# Patient Record
Sex: Male | Born: 1960 | Race: Black or African American | Hispanic: No | Marital: Married | Smoking: Current every day smoker
Health system: Southern US, Community
[De-identification: ages and names within clinical notes are randomized; demographics above are authoritative.]

## PROBLEM LIST (undated history)

## (undated) DIAGNOSIS — I10 Essential (primary) hypertension: Secondary | ICD-10-CM

## (undated) HISTORY — PX: APPENDECTOMY: SHX54

## (undated) HISTORY — PX: OTHER SURGICAL HISTORY: SHX169

---

## 2003-04-24 ENCOUNTER — Other Ambulatory Visit: Payer: Self-pay

## 2004-02-06 ENCOUNTER — Emergency Department: Payer: Self-pay | Admitting: Emergency Medicine

## 2004-10-21 ENCOUNTER — Emergency Department: Payer: Self-pay | Admitting: Emergency Medicine

## 2005-11-06 ENCOUNTER — Ambulatory Visit: Payer: Self-pay | Admitting: General Surgery

## 2008-03-21 ENCOUNTER — Emergency Department: Payer: Self-pay | Admitting: Internal Medicine

## 2008-03-27 ENCOUNTER — Emergency Department: Payer: Self-pay | Admitting: Emergency Medicine

## 2009-02-10 ENCOUNTER — Emergency Department: Payer: Self-pay | Admitting: Emergency Medicine

## 2009-02-24 ENCOUNTER — Ambulatory Visit: Payer: Self-pay | Admitting: Unknown Physician Specialty

## 2010-04-03 ENCOUNTER — Ambulatory Visit: Payer: Self-pay | Admitting: Surgery

## 2010-04-09 ENCOUNTER — Ambulatory Visit: Payer: Self-pay | Admitting: Surgery

## 2012-02-02 ENCOUNTER — Ambulatory Visit
Admission: RE | Admit: 2012-02-02 | Discharge: 2012-02-02 | Disposition: A | Discharge status: Home / Self Care | Payer: No Typology Code available for payment source | Source: Ambulatory Visit | Attending: Chiropractor | Admitting: Chiropractor

## 2012-02-02 ENCOUNTER — Other Ambulatory Visit: Payer: Self-pay | Admitting: Chiropractic Medicine

## 2012-02-02 DIAGNOSIS — R52 Pain, unspecified: Secondary | ICD-10-CM

## 2012-11-24 ENCOUNTER — Ambulatory Visit: Payer: Self-pay | Admitting: Internal Medicine

## 2014-01-03 IMAGING — CT CT ABD-PELV W/ CM
1 of 2 series · 14 of 32 positions shown, 18 images · non-contrast
Comparison: none

REASON FOR EXAM: recurrent GI bleeding abd pain weight loss
COMMENTS:

[Series 2: abd 3mm w 3.0 i40f 3 · axial · 0.81mm/px · z∈[-956,-551]mm · 14 of 155 slices shown, 18 images]
[im 13/155  soft-tissue]
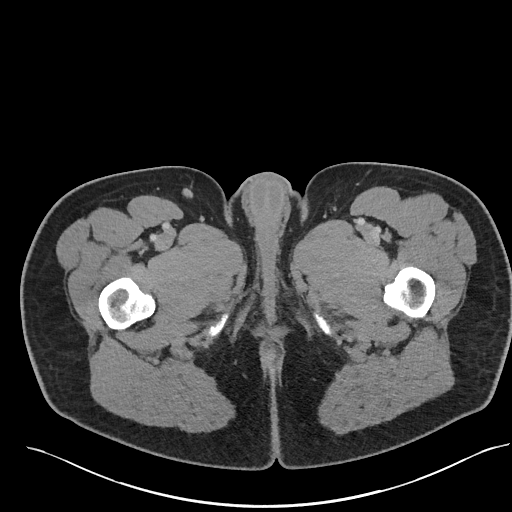
[im 13/155  bone]
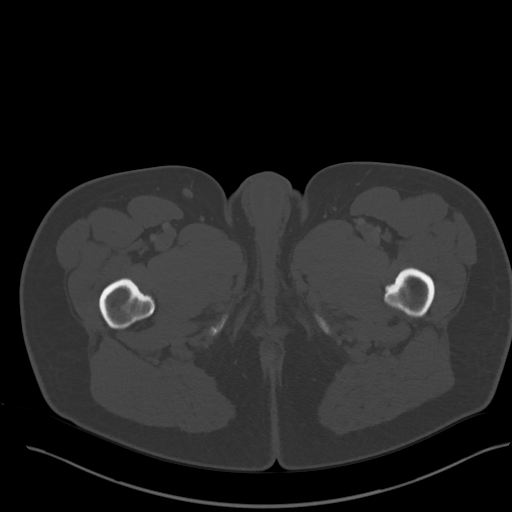
[im 25/155  soft-tissue]
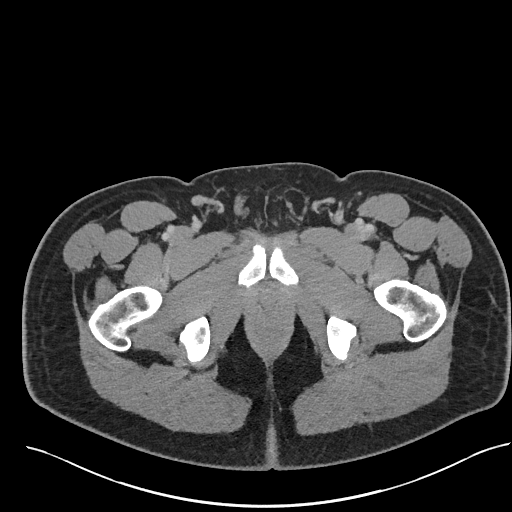
[im 37/155  soft-tissue]
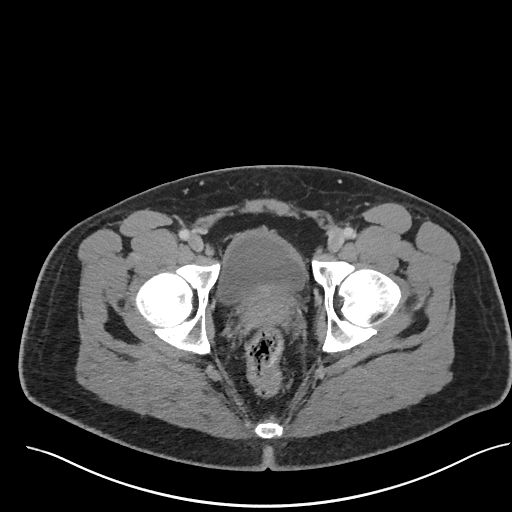
[im 50/155  soft-tissue]
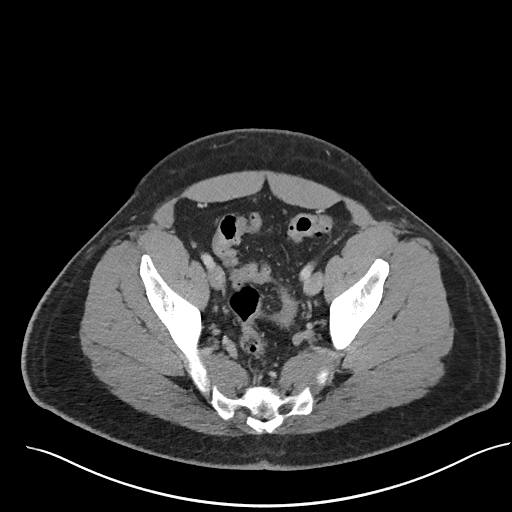
[im 62/155  soft-tissue]
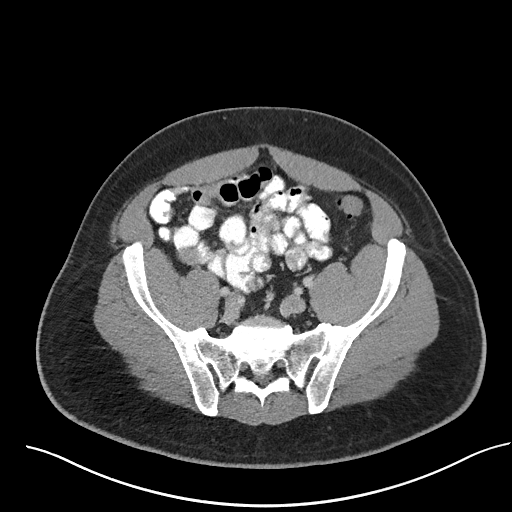
[im 74/155  soft-tissue]
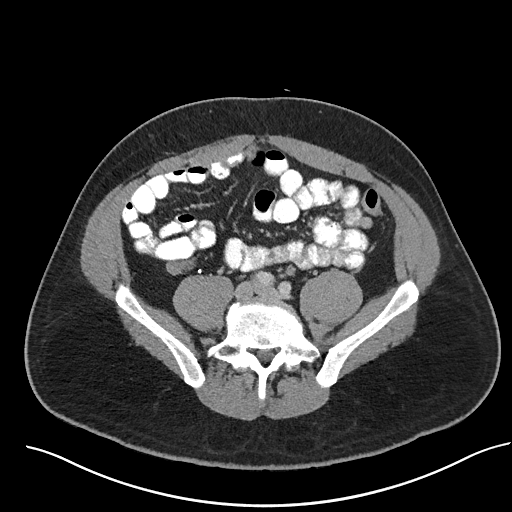
[im 87/155  soft-tissue]
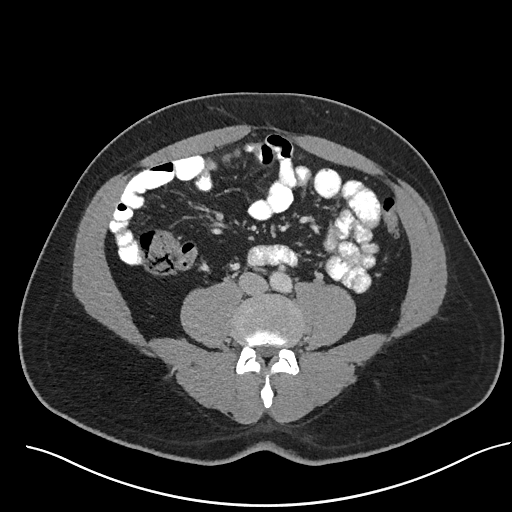
[im 99/155  soft-tissue]
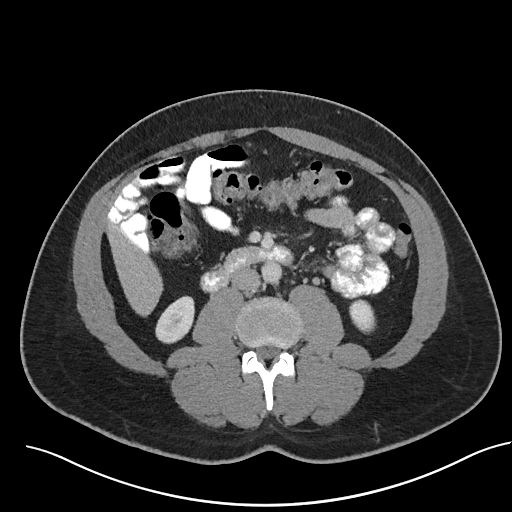
[im 111/155  soft-tissue]
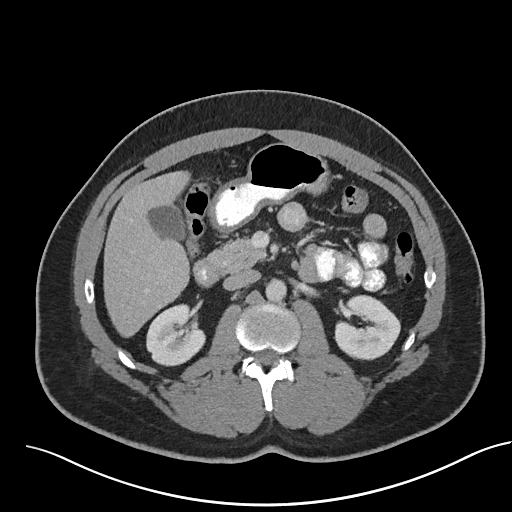
[im 111/155  bone]
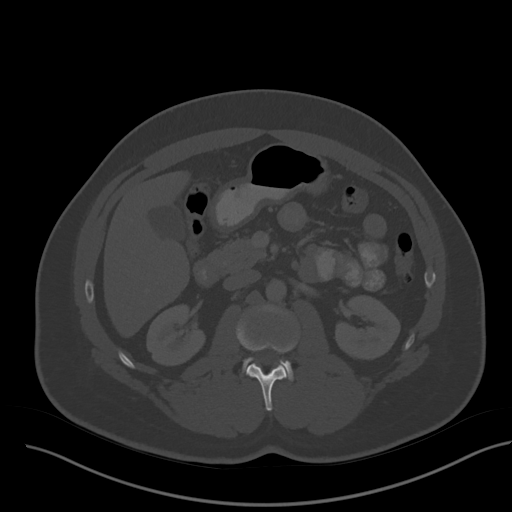
[im 124/155  soft-tissue]
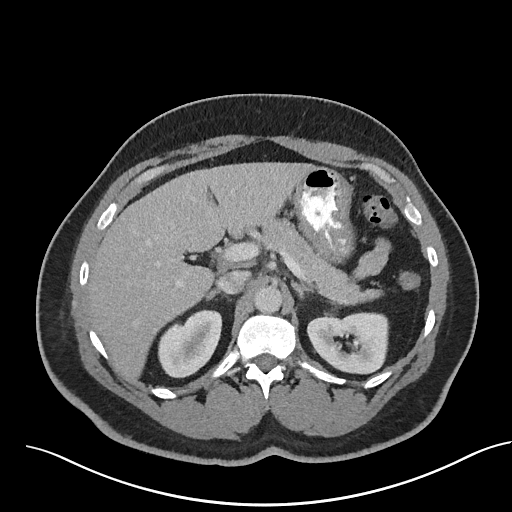
[im 130/155  lung]
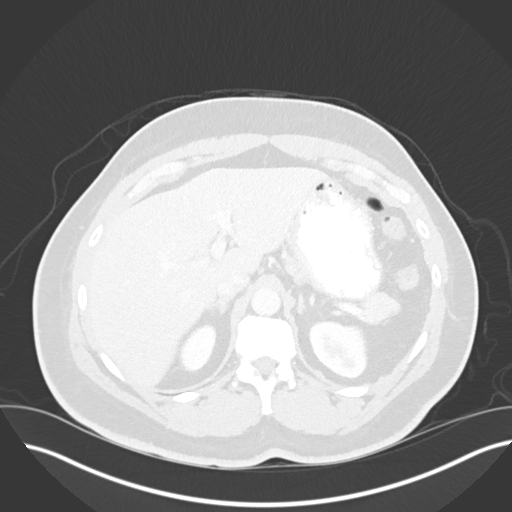
[im 136/155  soft-tissue]
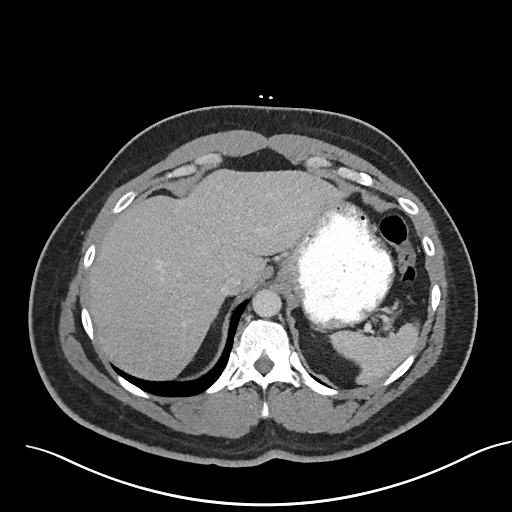
[im 136/155  lung]
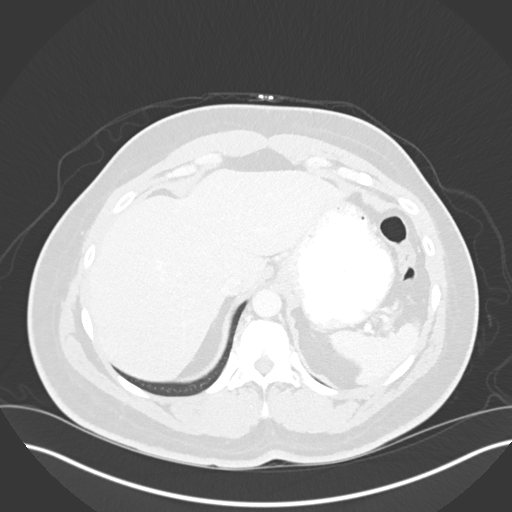
[im 142/155  lung]
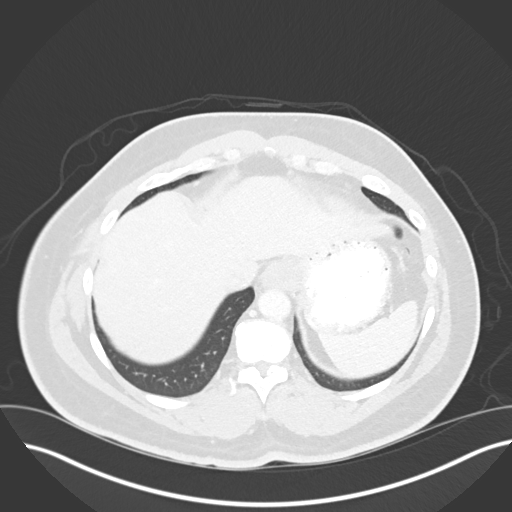
[im 148/155  soft-tissue]
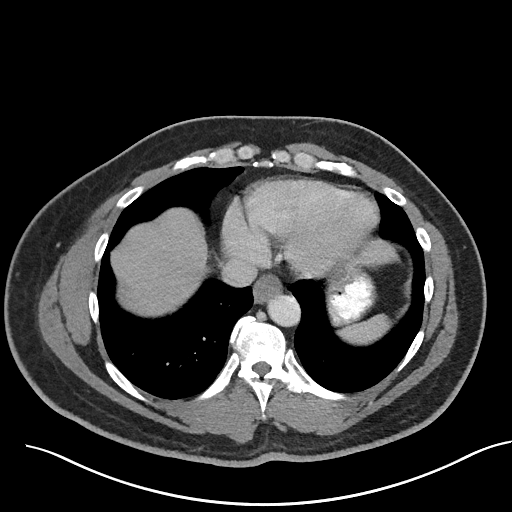
[im 148/155  lung]
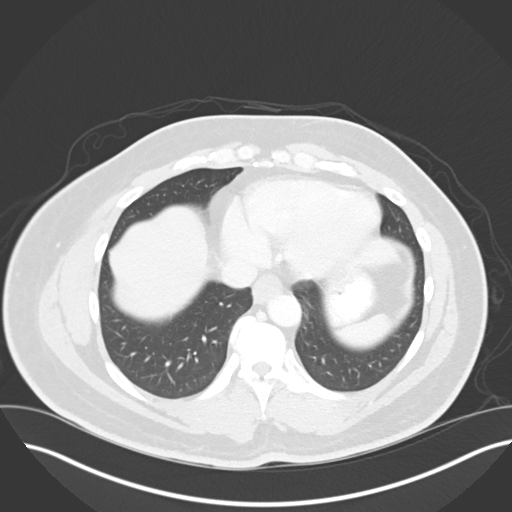

[14 of 32 positions shown; findings below may reference images not displayed]

PROCEDURE:     KCT - KCT ABDOMEN/PELVIS W  - November 24, 2012  [DATE]

RESULT:     Axial CT scanning was performed through the abdomen and pelvis
with reconstructions at 3 mm intervals and slice thicknesses following
intravenous administration of 1[REDACTED] intravenously. The patient
also received oral contrast material. Review of multiplanar reconstructed
images was performed separately on the VIA monitor.

There is an 8mm diameter hypodensity in the right hepatic lobe most
compatible with a cyst. There is no intrahepatic ductal dilation. The
gallbladder, pancreas, spleen, partially distended stomach, adrenal glands,
and kidneys are normal in appearance. The caliber of the abdominal aorta is
normal. There is no periaortic nor pericaval lymphadenopathy.

The orally administered contrast has traversed the normal-appearing small
bowel but has not yet reached the colon. The stool and gas pattern within
the colon does not suggest obstruction nor acute inflammation. There are
scattered diverticula throughout the colon with a greater number in the
sigmoid colon. There is no objective evidence of acute diverticulitis. There
is no free fluid in the pelvis. There is no evidence of a diverticular
abscess. The partially distended urinary bladder exhibits a prominent
impression upon its base by the mildly enlarged prostate gland. There is no
inguinal nor significant umbilical hernia. There is no evidence of a psoas
abscess.

The lumbar vertebral bodies are preserved in height. The bony pelvis
exhibits no lytic nor blastic lesion. There are degenerative changes of the
left SI joint and there is a pseudarthrosis on the left at L5 with respect
to S1. The lung bases are clear.
IMPRESSION: 1. There are scattered diverticula throughout the colon with more prominent
diverticulosis of the rectosigmoid. There is no objective evidence of acute
diverticulitis. Certainly low-grade diverticulitis could be present without
significant CT findings. Correlation with patient's clinical and laboratory
findings is needed. Colonoscopy may be useful if the patient's creatinine
remains unexplained..
2. There is no evidence of acute hepatobiliary abnormality nor acute urinary
tract abnormality.
3. There is no intra-abdominal nor pelvic lymphadenopathy, abnormal fluid
collections, nor of ascites.

[REDACTED]

## 2014-10-02 ENCOUNTER — Encounter: Payer: Self-pay | Admitting: Emergency Medicine

## 2014-10-02 ENCOUNTER — Emergency Department
Admission: EM | Admit: 2014-10-02 | Discharge: 2014-10-02 | Disposition: A | Discharge status: Home / Self Care | Payer: PRIVATE HEALTH INSURANCE | Attending: Emergency Medicine | Admitting: Emergency Medicine

## 2014-10-02 DIAGNOSIS — I10 Essential (primary) hypertension: Secondary | ICD-10-CM | POA: Insufficient documentation

## 2014-10-02 DIAGNOSIS — Z720 Tobacco use: Secondary | ICD-10-CM | POA: Diagnosis not present

## 2014-10-02 HISTORY — DX: Essential (primary) hypertension: I10

## 2014-10-02 NOTE — Discharge Instructions (Signed)
Advised 3 day Blood pressure check and follow up with PCP. How to Take Your Blood Pressure HOW DO I GET A BLOOD PRESSURE MACHINE?  You can buy an electronic home blood pressure machine at your local pharmacy. Insurance will sometimes cover the cost if you have a prescription.  Ask your doctor what type of machine is best for you. There are different machines for your arm and your wrist.  If you decide to buy a machine to check your blood pressure on your arm, first check the size of your arm so you can buy the right size cuff. To check the size of your arm:   Use a measuring tape that shows both inches and centimeters.   Wrap the measuring tape around the upper-middle part of your arm. You may need someone to help you measure.   Write down your arm measurement in both inches and centimeters.   To measure your blood pressure correctly, it is important to have the right size cuff.   If your arm is up to 13 inches (up to 34 centimeters), get an adult cuff size.  If your arm is 13 to 17 inches (35 to 44 centimeters), get a large adult cuff size.    If your arm is 17 to 20 inches (45 to 52 centimeters), get an adult thigh cuff.  WHAT DO THE NUMBERS MEAN?   There are two numbers that make up your blood pressure. For example: 120/80.  The first number (120 in our example) is called the "systolic pressure." It is a measure of the pressure in your blood vessels when your heart is pumping blood.  The second number (80 in our example) is called the "diastolic pressure." It is a measure of the pressure in your blood vessels when your heart is resting between beats.  Your doctor will tell you what your blood pressure should be. WHAT SHOULD I DO BEFORE I CHECK MY BLOOD PRESSURE?   Try to rest or relax for at least 30 minutes before you check your blood pressure.  Do not smoke.  Do not have any drinks with caffeine, such as:  Soda.  Coffee.  Tea.  Check your blood pressure in a  quiet room.  Sit down and stretch out your arm on a table. Keep your arm at about the level of your heart. Let your arm relax.  Make sure that your legs are not crossed. HOW DO I CHECK MY BLOOD PRESSURE?  Follow the directions that came with your machine.  Make sure you remove any tight-fighting clothing from your arm or wrist. Wrap the cuff around your upper arm or wrist. You should be able to fit a finger between the cuff and your arm. If you cannot fit a finger between the cuff and your arm, it is too tight and should be removed and rewrapped.  Some units require you to manually pump up the arm cuff.  Automatic units inflate the cuff when you press a button.  Cuff deflation is automatic in both models.  After the cuff is inflated, the unit measures your blood pressure and pulse. The readings are shown on a monitor. Hold still and breathe normally while the cuff is inflated.  Getting a reading takes less than a minute.  Some models store readings in a memory. Some provide a printout of readings. If your machine does not store your readings, keep a written record.  Take readings with you to your next visit with your doctor. Document  Released: 03/18/2008 Document Revised: 08/20/2013 Document Reviewed: 05/31/2013 [facility] Patient Information 2015 [place], [place]. This information is not intended to replace advice given to you by your health care provider. Make sure you discuss any questions you have with your health care provider.

## 2014-10-02 NOTE — ED Provider Notes (Signed)
[facility] Emergency Department Provider Note  ____________________________________________  Time seen: Approximately 7:14 PM  I have reviewed the triage vital signs and the nursing notes.   HISTORY  Chief Complaint Hypertension    HPI Jesus Baxter is a 54 y.o. male here today secondary to elevated blood pressure found on a blood pressure machine at the local drug store. Patient stated the reading was high because his family doctor and was told to come to the ER. Patient is on clonidine 0.1 mg twice a day. First doses take about 8:00 this morning. Second dose taken about 1700 hrs. today. Patient denies any symptoms secondary to his elevated reading. Patient stating he's been on the same blood pressure medication for 6 months.   Past Medical History  Diagnosis Date  . Hypertension     There are no active problems to display for this patient.   Past Surgical History  Procedure Laterality Date  . Hemorrhoidectomy    . Appendectomy      No current outpatient prescriptions on file.  Allergies Review of patient's allergies indicates no known allergies.  No family history on file.  Social History History  Substance Use Topics  . Smoking status: Current Every Day Smoker -- 0.50 packs/day    Types: Cigarettes  . Smokeless tobacco: Not on file  . Alcohol Use: 1.8 oz/week    3 Cans of beer per week    Review of Systems Constitutional: No fever/chills Eyes: No visual changes. ENT: No sore throat. Cardiovascular: Denies chest pain. Respiratory: Denies shortness of breath. Gastrointestinal: No abdominal pain.  No nausea, no vomiting.  No diarrhea.  No constipation. Genitourinary: Negative for dysuria. Musculoskeletal: Negative for back pain. Skin: Negative for rash. Neurological: Negative for headaches, focal weakness or numbness. 10-point ROS otherwise negative.  ____________________________________________   PHYSICAL  EXAM:  VITAL SIGNS: ED Triage Vitals  Enc Vitals Group     BP 10/02/14 1653 172/100 mmHg     Pulse Rate 10/02/14 1653 88     Resp --      Temp 10/02/14 1653 98.7 F (37.1 C)     Temp Source 10/02/14 1653 Oral     SpO2 10/02/14 1653 98 %     Weight 10/02/14 1653 220 lb (99.791 kg)     Height 10/02/14 1653 5\' 6"  (1.676 m)     Head Cir --      Peak Flow --      Pain Score 10/02/14 1659 0     Pain Loc --      Pain Edu? --      Excl. in GC? --     Constitutional: Alert and oriented. Well appearing and in no acute distress. Eyes: Conjunctivae are normal. PERRL. EOMI. Head: Atraumatic. Nose: No congestion/rhinnorhea. Mouth/Throat: Mucous membranes are moist.  Oropharynx non-erythematous. Neck: No stridor.  Hematological/Lymphatic/Immunilogical: No cervical lymphadenopathy. Cardiovascular: Normal rate, regular rhythm. Grossly normal heart sounds.  Good peripheral circulation. BP was elevated at 1659 hrs. at 172/100. Respiratory: Normal respiratory effort.  No retractions. Lungs CTAB. Gastrointestinal: Soft and nontender. No distention. No abdominal bruits. No CVA tenderness. Musculoskeletal: No lower extremity tenderness nor edema.  No joint effusions. Neurologic:  Normal speech and language. No gross focal neurologic deficits are appreciated. Speech is normal. No gait instability. Skin:  Skin is warm, dry and intact. No rash noted. Psychiatric: Mood and affect are normal. Speech and behavior are normal.  ____________________________________________   LABS (all labs ordered are listed, but only abnormal  results are displayed)  Labs Reviewed - No data to display ____________________________________________  EKG   ____________________________________________  RADIOLOGY   ____________________________________________   PROCEDURES  Procedure(s) performed: None  Critical Care performed: No  ____________________________________________   INITIAL IMPRESSION /  ASSESSMENT AND PLAN / ED COURSE  Pertinent labs & imaging results that were available during my care of the patient were reviewed by me and considered in my medical decision making (see chart for details).  Hypertension ____________________________________________   FINAL CLINICAL IMPRESSION(S) / ED DIAGNOSES  Final diagnoses:  Essential hypertension      Joni Reining, PA-C 10/02/14 1922  Maurilio Lovely, MD 10/03/14 [phone]

## 2014-10-02 NOTE — ED Notes (Signed)
Pt here with c/o hypertension; Pt [place] drug store [place] and they checked his BP. Pt came here for same; called PCP and was told to come here. Pt had BP medications changed 6+ months ago.

## 2014-10-02 NOTE — ED Notes (Signed)
Pt denies any symptoms. Had blood pressure checked today and read high, doctor advised pt to come to ER.  Pt complaint with meds. Pt takes clonidine

## 2014-10-18 ENCOUNTER — Emergency Department
Admission: EM | Admit: 2014-10-18 | Discharge: 2014-10-18 | Discharge status: Against Medical Advice | Payer: PRIVATE HEALTH INSURANCE | Attending: Emergency Medicine | Admitting: Emergency Medicine

## 2014-10-18 ENCOUNTER — Other Ambulatory Visit: Payer: Self-pay

## 2014-10-18 ENCOUNTER — Encounter: Payer: Self-pay | Admitting: Emergency Medicine

## 2014-10-18 DIAGNOSIS — I499 Cardiac arrhythmia, unspecified: Secondary | ICD-10-CM | POA: Insufficient documentation

## 2014-10-18 DIAGNOSIS — I10 Essential (primary) hypertension: Secondary | ICD-10-CM | POA: Insufficient documentation

## 2014-10-18 DIAGNOSIS — Z720 Tobacco use: Secondary | ICD-10-CM | POA: Insufficient documentation

## 2014-10-18 LAB — CBC
HEMATOCRIT: 44.2 % (ref 40.0–52.0)
Hemoglobin: 14.2 g/dL (ref 13.0–18.0)
MCH: 29.9 pg (ref 26.0–34.0)
MCHC: 32.1 g/dL (ref 32.0–36.0)
MCV: 93.1 fL (ref 80.0–100.0)
PLATELETS: 238 10*3/uL (ref 150–440)
RBC: 4.74 MIL/uL (ref 4.40–5.90)
RDW: 14.4 % (ref 11.5–14.5)
WBC: 7.5 10*3/uL (ref 3.8–10.6)

## 2014-10-18 LAB — COMPREHENSIVE METABOLIC PANEL
ALT: 20 U/L (ref 17–63)
ANION GAP: 8 (ref 5–15)
AST: 21 U/L (ref 15–41)
Albumin: 4.2 g/dL (ref 3.5–5.0)
Alkaline Phosphatase: 68 U/L (ref 38–126)
BUN: 11 mg/dL (ref 6–20)
CALCIUM: 8.6 mg/dL — AB (ref 8.9–10.3)
CHLORIDE: 110 mmol/L (ref 101–111)
CO2: 24 mmol/L (ref 22–32)
Creatinine, Ser: 1.1 mg/dL (ref 0.61–1.24)
GFR calc Af Amer: >60 mL/min (ref >60)
GFR calc non Af Amer: >60 mL/min (ref >60)
GLUCOSE: 131 mg/dL — AB (ref 65–99)
POTASSIUM: 3.5 mmol/L (ref 3.5–5.1)
SODIUM: 142 mmol/L (ref 135–145)
Total Bilirubin: 0.4 mg/dL (ref 0.3–1.2)
Total Protein: 7.2 g/dL (ref 6.5–8.1)

## 2014-10-18 LAB — TROPONIN I: Troponin I: <0.03 ng/mL (ref <0.031)

## 2014-10-18 NOTE — ED Notes (Signed)
States he has started a new med 2 weeks ago and makes him feel funny when he takes it.  This am worse with palpatations.  Skin w/d, no sob.

## 2017-07-28 ENCOUNTER — Emergency Department
Admission: EM | Admit: 2017-07-28 | Discharge: 2017-07-28 | Disposition: A | Discharge status: Home / Self Care | Payer: PRIVATE HEALTH INSURANCE | Attending: Emergency Medicine | Admitting: Emergency Medicine

## 2017-07-28 DIAGNOSIS — F17210 Nicotine dependence, cigarettes, uncomplicated: Secondary | ICD-10-CM | POA: Insufficient documentation

## 2017-07-28 DIAGNOSIS — I10 Essential (primary) hypertension: Secondary | ICD-10-CM | POA: Diagnosis not present

## 2017-07-28 DIAGNOSIS — T783XXA Angioneurotic edema, initial encounter: Secondary | ICD-10-CM | POA: Diagnosis not present

## 2017-07-28 DIAGNOSIS — J029 Acute pharyngitis, unspecified: Secondary | ICD-10-CM | POA: Diagnosis present

## 2017-07-28 LAB — GROUP A STREP BY PCR: Group A Strep by PCR: NOT DETECTED

## 2017-07-28 MED ORDER — DIPHENHYDRAMINE HCL 25 MG PO CAPS
50.0000 mg | ORAL_CAPSULE | Freq: Once | ORAL | Status: AC
  Administered 2017-07-28: 50 mg via ORAL

## 2017-07-28 MED ORDER — FAMOTIDINE 20 MG PO TABS
20.0000 mg | ORAL_TABLET | Freq: Once | ORAL | Status: AC
  Administered 2017-07-28: 20 mg via ORAL

## 2017-07-28 MED ORDER — DEXAMETHASONE SODIUM PHOSPHATE 10 MG/ML IJ SOLN
10.0000 mg | Freq: Once | INTRAMUSCULAR | Status: AC
  Administered 2017-07-28: 10 mg

## 2017-07-28 MED ORDER — DEXAMETHASONE SODIUM PHOSPHATE 10 MG/ML IJ SOLN
INTRAMUSCULAR | Status: AC
  Filled 2017-07-28: qty 1

## 2017-07-28 MED ORDER — DIPHENHYDRAMINE HCL 25 MG PO CAPS
ORAL_CAPSULE | ORAL | Status: AC
  Filled 2017-07-28: qty 2

## 2017-07-28 MED ORDER — FAMOTIDINE 20 MG PO TABS
ORAL_TABLET | ORAL | Status: AC
  Filled 2017-07-28: qty 1

## 2017-07-28 NOTE — ED Provider Notes (Signed)
[facility] Emergency Department Provider Note  ____________________________________________   None    (approximate)  I have reviewed the triage vital signs and the nursing notes.   HISTORY  Chief Complaint Foreign Body   HPI Jesus Baxter is a 57 y.o. male who self presents to the emergency department with 1 day of sore throat.  He feels like "my uvula is swollen".  The back of his throat feels "itchy".  No chest pain or shortness of breath.  No drooling.  No history of anaphylaxis or allergic reaction.  No fevers or chills.  No cough.  No history of the same.  Past Medical History:  Diagnosis Date  . Hypertension     There are no active problems to display for this patient.   Past Surgical History:  Procedure Laterality Date  . APPENDECTOMY    . hemorrhoidectomy      Prior to Admission medications   Not on File    Allergies Patient has no known allergies.  No family history on file.  Social History Social History   Tobacco Use  . Smoking status: Current Every Day Smoker    Packs/day: 0.50    Types: Cigarettes  . Smokeless tobacco: Never Used  Substance Use Topics  . Alcohol use: Yes    Alcohol/week: 1.8 oz    Types: 3 Cans of beer per week  . Drug use: No    Review of Systems Constitutional: No fever/chills ENT: Positive for sore throat. Cardiovascular: Denies chest pain. Respiratory: Denies shortness of breath. Gastrointestinal: No abdominal pain.  No nausea, no vomiting.  No diarrhea.  No constipation. Musculoskeletal: Negative for back pain. Neurological: Negative for headaches   ____________________________________________   PHYSICAL EXAM:  VITAL SIGNS: ED Triage Vitals  Enc Vitals Group     BP 07/28/17 0041 (!) 173/99     Pulse Rate 07/28/17 0041 92     Resp 07/28/17 0041 16     Temp 07/28/17 0041 (!) 92 F (33.3 C)     Temp Source 07/28/17 0041 Oral     SpO2 07/28/17 0041 97 %     Weight  07/28/17 0043 220 lb (99.8 kg)     Height --      Head Circumference --      Peak Flow --      Pain Score 07/28/17 0042 5     Pain Loc --      Pain Edu? --      Excl. in GC? --     Constitutional: Alert and oriented x4 well-appearing nontoxic no diaphoresis speaks full clear sentences Head: Atraumatic. Nose: No congestion/rhinnorhea. Mouth/Throat: No trismus uvula erythematous and elongated although no pharyngeal erythema or exudate Neck: No stridor.   Respiratory: Normal respiratory effort.  No retractions.  Clear to auscultation bilaterally Neurologic:  Normal speech and language. No gross focal neurologic deficits are appreciated.  Skin:  Skin is warm, dry and intact. No rash noted.    ____________________________________________  LABS (all labs ordered are listed, but only abnormal results are displayed)  Labs Reviewed  GROUP A STREP BY PCR    Strep reviewed by me is negative __________________________________________  EKG   ____________________________________________  RADIOLOGY   ____________________________________________   DIFFERENTIAL includes but not limited to  Strep pharyngitis, Quincke's angioedema, allergic reaction   PROCEDURES  Procedure(s) performed: no  Procedures  Critical Care performed: no  Observation: no ____________________________________________   INITIAL IMPRESSION / ASSESSMENT AND PLAN / ED COURSE  Pertinent labs & imaging results that were available during my care of the patient were reviewed by me and considered in my medical decision making (see chart for details).  The patient arrives hemodynamically stable and well-appearing with no evidence of posterior involvement.  His uvula is erythematous and elongated.  Strep is negative.  His symptoms are most consistent with allergic uvulitis.  Given a dose of Decadron Pepcid Benadryl and H1 blockers for home.  Strict return precautions have been given and patient verbalized  understanding and agreement with the plan.      ____________________________________________   FINAL CLINICAL IMPRESSION(S) / ED DIAGNOSES  Final diagnoses:  Angioedema, initial encounter      NEW MEDICATIONS STARTED DURING THIS VISIT:  There are no discharge medications for this patient.    Note:  This document was prepared using Dragon voice recognition software and may include unintentional dictation errors.      Merrily Brittleifenbark, Magdaline Zollars, MD 07/28/17 [phone]

## 2017-07-28 NOTE — Discharge Instructions (Signed)
Please take 25 mg of Benadryl 3 times a day as needed and follow-up with your primary care physician for any concerns.  Return to the emergency department immediately for drooling, shortness of breath, if you cannot swallow, or for any other issues whatsoever.  It was a pleasure to take care of you today, and thank you for coming to our emergency department.  If you have any questions or concerns before leaving please ask the nurse to grab me and I'm more than happy to go through your aftercare instructions again.  If you were prescribed any opioid pain medication today such as Norco, Vicodin, Percocet, morphine, hydrocodone, or oxycodone please make sure you do not drive when you are taking this medication as it can alter your ability to drive safely.  If you have any concerns once you are home that you are not improving or are in fact getting worse before you can make it to your follow-up appointment, please do not hesitate to call 911 and come back for further evaluation.  Merrily BrittleNeil Ramere Downs, MD  Results for orders placed or performed during the hospital encounter of 07/28/17  Group A Strep by PCR  Result Value Ref Range   Group A Strep by PCR NOT DETECTED NOT DETECTED

## 2017-07-28 NOTE — ED Triage Notes (Addendum)
Patient c/o feeling something stuck in throat since eating this evening at 2000. Patient denies throat tightness. Patient able to speak in complete sentences and maintain control of saliva. Patient reports he believes his tonsil gets stuck in his throat - can feel it move up and down when he swallows.

## 2019-07-19 ENCOUNTER — Ambulatory Visit: Payer: PRIVATE HEALTH INSURANCE | Attending: Internal Medicine

## 2019-07-19 DIAGNOSIS — Z23 Encounter for immunization: Secondary | ICD-10-CM

## 2019-07-19 NOTE — Progress Notes (Signed)
   Covid-19 Vaccination Clinic  Name:  Jesus Baxter    MRN: [id number] DOB: 11-01-1960  07/19/2019  [name] was observed post Covid-19 immunization for 15 minutes without incident. He was provided with Vaccine Information Sheet and instruction to access the V-Safe system.   [name] was instructed to call 911 with any severe reactions post vaccine: Marland Kitchen Difficulty breathing  . Swelling of face and throat  . A fast heartbeat  . A bad rash all over body  . Dizziness and weakness   Immunizations Administered    Name Date Dose VIS Date Route   Pfizer COVID-19 Vaccine 07/19/2019  8:31 AM 0.3 mL 03/30/2019 Intramuscular   Manufacturer: [employer], [employer]   Lot: [id number]   NDC: [address]-2

## 2019-08-14 ENCOUNTER — Ambulatory Visit: Payer: PRIVATE HEALTH INSURANCE | Attending: Internal Medicine

## 2019-08-14 DIAGNOSIS — Z23 Encounter for immunization: Secondary | ICD-10-CM

## 2019-08-14 NOTE — Progress Notes (Signed)
   Covid-19 Vaccination Clinic  Name:  Lytle Malburg    MRN: [id number] DOB: Jun 11, 1960  08/14/2019  Mr. Cost was observed post Covid-19 immunization for 15 minutes without incident. He was provided with Vaccine Information Sheet and instruction to access the V-Safe system.   Mr. Willinger was instructed to call 911 with any severe reactions post vaccine: Marland Kitchen Difficulty breathing  . Swelling of face and throat  . A fast heartbeat  . A bad rash all over body  . Dizziness and weakness   Immunizations Administered    Name Date Dose VIS Date Route   Pfizer COVID-19 Vaccine 08/14/2019  8:47 AM 0.3 mL 06/13/2018 Intramuscular   Manufacturer: [employer], [employer]   Lot: [id number]   NDC: [address]-4

## 2022-11-22 ENCOUNTER — Ambulatory Visit: Payer: PRIVATE HEALTH INSURANCE | Admitting: Certified Registered"

## 2022-11-22 ENCOUNTER — Encounter: Payer: Self-pay | Admitting: *Deleted

## 2022-11-22 ENCOUNTER — Encounter
Admission: RE | Disposition: A | Discharge status: Home / Self Care | Payer: Self-pay | Source: Home / Self Care | Attending: Gastroenterology

## 2022-11-22 ENCOUNTER — Ambulatory Visit
Admission: RE | Admit: 2022-11-22 | Discharge: 2022-11-22 | Disposition: A | Discharge status: Home / Self Care | Payer: PRIVATE HEALTH INSURANCE | Attending: Gastroenterology | Admitting: Gastroenterology

## 2022-11-22 DIAGNOSIS — I10 Essential (primary) hypertension: Secondary | ICD-10-CM | POA: Insufficient documentation

## 2022-11-22 DIAGNOSIS — Z86010 Personal history of colonic polyps: Secondary | ICD-10-CM | POA: Diagnosis not present

## 2022-11-22 DIAGNOSIS — Z1211 Encounter for screening for malignant neoplasm of colon: Secondary | ICD-10-CM | POA: Insufficient documentation

## 2022-11-22 DIAGNOSIS — Z9049 Acquired absence of other specified parts of digestive tract: Secondary | ICD-10-CM | POA: Diagnosis not present

## 2022-11-22 DIAGNOSIS — K5730 Diverticulosis of large intestine without perforation or abscess without bleeding: Secondary | ICD-10-CM | POA: Diagnosis not present

## 2022-11-22 DIAGNOSIS — K640 First degree hemorrhoids: Secondary | ICD-10-CM | POA: Diagnosis not present

## 2022-11-22 HISTORY — PX: COLONOSCOPY WITH PROPOFOL: SHX5780

## 2022-11-22 SURGERY — COLONOSCOPY WITH PROPOFOL
Anesthesia: General

## 2022-11-22 MED ORDER — PROPOFOL 10 MG/ML IV BOLUS
INTRAVENOUS | Status: DC | PRN
Start: 2022-11-22 — End: 2022-11-22
  Administered 2022-11-22: 100 mg via INTRAVENOUS

## 2022-11-22 MED ORDER — SODIUM CHLORIDE 0.9 % IV SOLN
INTRAVENOUS | Status: DC
  Administered 2022-11-22: 1000 mL via INTRAVENOUS

## 2022-11-22 MED ORDER — LACTATED RINGERS IV SOLN: INTRAVENOUS | Status: DC | PRN

## 2022-11-22 MED ORDER — LIDOCAINE HCL (CARDIAC) PF 100 MG/5ML IV SOSY
PREFILLED_SYRINGE | INTRAVENOUS | Status: DC | PRN
  Administered 2022-11-22: 100 mg via INTRAVENOUS

## 2022-11-22 MED ORDER — PROPOFOL 500 MG/50ML IV EMUL
INTRAVENOUS | Status: DC | PRN
  Administered 2022-11-22: 160 ug/kg/min via INTRAVENOUS

## 2022-11-22 NOTE — Anesthesia Preprocedure Evaluation (Addendum)
Anesthesia Evaluation  Patient identified by MRN, date of birth, ID band Patient awake    Reviewed: Allergy & Precautions, H&P , NPO status , Patient's Chart, lab work & pertinent test results  Airway Mallampati: III  TM Distance: >3 FB Neck ROM: full    Dental no notable dental hx.    Pulmonary Current Smoker and Patient abstained from smoking.   Pulmonary exam normal        Cardiovascular Exercise Tolerance: Good hypertension, Normal cardiovascular exam     Neuro/Psych negative neurological ROS  negative psych ROS   GI/Hepatic negative GI ROS, Neg liver ROS,,,  Endo/Other  negative endocrine ROS    Renal/GU negative Renal ROS  negative genitourinary   Musculoskeletal   Abdominal  (+) + obese  Peds  Hematology negative hematology ROS (+)   Anesthesia Other Findings Past Medical History: No date: Hypertension  Past Surgical History: No date: APPENDECTOMY No date: hemorrhoidectomy     Reproductive/Obstetrics negative OB ROS                             Anesthesia Physical Anesthesia Plan  ASA: 2  Anesthesia Plan: General   Post-op Pain Management:    Induction: Intravenous  PONV Risk Score and Plan: Propofol infusion and TIVA  Airway Management Planned: Natural Airway  Additional Equipment:   Intra-op Plan:   Post-operative Plan:   Informed Consent: I have reviewed the patients History and Physical, chart, labs and discussed the procedure including the risks, benefits and alternatives for the proposed anesthesia with the patient or authorized representative who has indicated his/her understanding and acceptance.     Dental Advisory Given  Plan Discussed with: CRNA and Surgeon  Anesthesia Plan Comments:         Anesthesia Quick Evaluation

## 2022-11-22 NOTE — Interval H&P Note (Signed)
History and Physical Interval Note:  11/22/2022 8:06 AM  Jesus Baxter  has presented today for surgery, with the diagnosis of h/o TA polyps.  The various methods of treatment have been discussed with the patient and family. After consideration of risks, benefits and other options for treatment, the patient has consented to  Procedure(s): COLONOSCOPY WITH PROPOFOL (N/A) as a surgical intervention.  The patient's history has been reviewed, patient examined, no change in status, stable for surgery.  I have reviewed the patient's chart and labs.  Questions were answered to the patient's satisfaction.     Regis Bill  Ok to proceed with colonoscopy

## 2022-11-22 NOTE — Op Note (Signed)
[facility] Gastroenterology Patient Name: Jesus Baxter Procedure Date: 11/22/2022 8:06 AM MRN: [id number] Account #: [id number] Date of Birth: 1960/12/16 Admit Type: Outpatient Age: 62 Room: [facility] ENDO ROOM 3 Gender: Male Note Status: Finalized Instrument Name: Prentice Docker [id number] Procedure:             Colonoscopy Indications:           Surveillance: Personal history of adenomatous polyps                         on last colonoscopy > 5 years ago Providers:             Eather Colas MD, MD Referring MD:          Daniel Nones, MD (Referring MD) Medicines:             Monitored Anesthesia Care Complications:         No immediate complications. Procedure:             Pre-Anesthesia Assessment:                        - Prior to the procedure, a History and Physical was                         performed, and patient medications and allergies were                         reviewed. The patient is competent. The risks and                         benefits of the procedure and the sedation options and                         risks were discussed with the patient. All questions                         were answered and informed consent was obtained.                         Patient identification and proposed procedure were                         verified by the physician, the nurse, the                         anesthesiologist, the anesthetist and the technician                         in the endoscopy suite. Mental Status Examination:                         alert and oriented. Airway Examination: normal                         oropharyngeal airway and neck mobility. Respiratory                         Examination: clear to auscultation. CV Examination:  normal. Prophylactic Antibiotics: The patient does not                         require prophylactic antibiotics. Prior                         Anticoagulants: The patient has taken no  anticoagulant                         or antiplatelet agents. ASA Grade Assessment: II - A                         patient with mild systemic disease. After reviewing                         the risks and benefits, the patient was deemed in                         satisfactory condition to undergo the procedure. The                         anesthesia plan was to use monitored anesthesia care                         (MAC). Immediately prior to administration of                         medications, the patient was re-assessed for adequacy                         to receive sedatives. The heart rate, respiratory                         rate, oxygen saturations, blood pressure, adequacy of                         pulmonary ventilation, and response to care were                         monitored throughout the procedure. The physical                         status of the patient was re-assessed after the                         procedure.                        After obtaining informed consent, the colonoscope was                         passed under direct vision. Throughout the procedure,                         the patient's blood pressure, pulse, and oxygen                         saturations were monitored continuously. The  Colonoscope was introduced through the anus and                         advanced to the the ileocecal valve. The colonoscopy                         was performed without difficulty. The patient                         tolerated the procedure well. The quality of the bowel                         preparation was fair except the ascending colon was                         unsatisfactory. The ileocecal valve and the rectum                         were photographed. Findings:      The perianal and digital rectal examinations were normal.      Scattered large-mouthed and small-mouthed diverticula were found in the       sigmoid colon, descending  colon and hepatic flexure.      Internal hemorrhoids were found during retroflexion. The hemorrhoids       were Grade I (internal hemorrhoids that do not prolapse).      The exam was otherwise without abnormality on direct and retroflexion       views. Impression:            - Diverticulosis in the sigmoid colon, in the                         descending colon and at the hepatic flexure.                        - Internal hemorrhoids.                        - The examination was otherwise normal on direct and                         retroflexion views.                        - No specimens collected. Recommendation:        - Discharge patient to home.                        - Resume previous diet.                        - Continue present medications.                        - Repeat colonoscopy in 1 year because the bowel                         preparation was suboptimal.                        - Return to referring physician as  previously                         scheduled. Procedure Code(s):     --- Professional ---                        U9811, Colorectal cancer screening; colonoscopy on                         individual at high risk Diagnosis Code(s):     --- Professional ---                        Z86.010, Personal history of colonic polyps                        K64.0, First degree hemorrhoids                        K57.30, Diverticulosis of large intestine without                         perforation or abscess without bleeding CPT copyright 2022 [employer]. All rights reserved. The codes documented in this report are preliminary and upon coder review may  be revised to meet current compliance requirements. Eather Colas MD, MD 11/22/2022 8:31:36 AM Number of Addenda: 0 Note Initiated On: 11/22/2022 8:06 AM Scope Withdrawal Time: 0 hours 4 minutes 58 seconds  Total Procedure Duration: 0 hours 8 minutes 30 seconds  Estimated Blood Loss:  Estimated blood loss:  none.      [facility]

## 2022-11-22 NOTE — Anesthesia Postprocedure Evaluation (Signed)
Anesthesia Post Note  Patient: Stepehn Louis Bristol  Procedure(s) Performed: COLONOSCOPY WITH PROPOFOL  Patient location during evaluation: Endoscopy Anesthesia Type: General Level of consciousness: awake and alert Pain management: pain level controlled Vital Signs Assessment: post-procedure vital signs reviewed and stable Respiratory status: spontaneous breathing, nonlabored ventilation and respiratory function stable Cardiovascular status: blood pressure returned to baseline and stable Postop Assessment: no apparent nausea or vomiting Anesthetic complications: no   No notable events documented.   Last Vitals:  Vitals:   11/22/22 0847 11/22/22 0900  BP: (!) 156/87 (!) 156/84  Pulse: 68 68  Resp:    Temp:    SpO2: 99% 100%    Last Pain:  Vitals:   11/22/22 0900  TempSrc:   PainSc: 0-No pain                 Foye Deer

## 2022-11-22 NOTE — Transfer of Care (Signed)
Immediate Anesthesia Transfer of Care Note  Patient: Jesus Baxter  Procedure(s) Performed: COLONOSCOPY WITH PROPOFOL  Patient Location: PACU  Anesthesia Type:General  Level of Consciousness: awake  Airway & Oxygen Therapy: Patient Spontanous Breathing  Post-op Assessment: Report given to RN and Post -op Vital signs reviewed and stable  Post vital signs: Reviewed and stable  Last Vitals:  Vitals Value Taken Time  BP 107/69 11/22/22 0832  Temp    Pulse 87 11/22/22 0832  Resp 18 11/22/22 0833  SpO2 94 % 11/22/22 0832  Vitals shown include unfiled device data.  Last Pain:  Vitals:   11/22/22 0800  TempSrc: Temporal  PainSc: Asleep         Complications: No notable events documented.

## 2022-11-22 NOTE — H&P (Signed)
Outpatient short stay form Pre-procedure 11/22/2022  Regis Bill, MD  Primary Physician: Lynnea Ferrier, MD  Reason for visit:  Surveillance  History of present illness:    62 y/o gentleman with history of hypertension here for surveillance colonoscopy for history of small Ta's on last colonoscopy in 2018. No blood thinners. No family history of GI malignancies. History of appendectomy.    Current Facility-Administered Medications:    0.9 %  sodium chloride infusion, , Intravenous, Continuous, Idamay Hosein, Rossie Muskrat, MD, Last Rate: 20 mL/hr at 11/22/22 0802, 1,000 mL at 11/22/22 0802  Medications Prior to Admission  Medication Sig Dispense Refill Last Dose   cloNIDine (CATAPRES) 0.2 MG tablet Take 0.2 mg by mouth 3 (three) times daily.   11/22/2022 at 530   hydrALAZINE (APRESOLINE) 100 MG tablet Take 100 mg by mouth 3 (three) times daily.   11/22/2022 at 530   spironolactone (ALDACTONE) 25 MG tablet Take 25 mg by mouth daily.   11/22/2022 at 530   verapamil (CALAN) 80 MG tablet Take 80 mg by mouth 2 (two) times daily.   11/22/2022 at 530     No Known Allergies   Past Medical History:  Diagnosis Date   Hypertension     Review of systems:  Otherwise negative.    Physical Exam  Gen: Alert, oriented. Appears stated age.  HEENT: PERRLA. Lungs: No respiratory distress CV: RRR Abd: soft, benign, no masses Ext: No edema    Planned procedures: Proceed with colonoscopy. The patient understands the nature of the planned procedure, indications, risks, alternatives and potential complications including but not limited to bleeding, infection, perforation, damage to internal organs and possible oversedation/side effects from anesthesia. The patient agrees and gives consent to proceed.  Please refer to procedure notes for findings, recommendations and patient disposition/instructions.     Regis Bill, MD [facility] Gastroenterology

## 2022-11-23 ENCOUNTER — Encounter: Payer: Self-pay | Admitting: Gastroenterology

## 2024-06-04 ENCOUNTER — Ambulatory Visit: Admit: 2024-06-04 | Payer: PRIVATE HEALTH INSURANCE

## 2024-06-04 SURGERY — COLONOSCOPY
Anesthesia: General
